# Patient Record
Sex: Female | Born: 1995 | Race: White | Hispanic: No | Marital: Single
Health system: Southern US, Community
[De-identification: ages and names within clinical notes are randomized; demographics above are authoritative.]

---

## 2016-03-29 ENCOUNTER — Encounter: Payer: Self-pay | Admitting: *Deleted

## 2016-03-29 ENCOUNTER — Ambulatory Visit (INDEPENDENT_AMBULATORY_CARE_PROVIDER_SITE_OTHER): Payer: Medicaid Other | Admitting: *Deleted

## 2016-03-29 ENCOUNTER — Encounter: Payer: Self-pay | Admitting: Family Medicine

## 2016-03-29 DIAGNOSIS — Z3201 Encounter for pregnancy test, result positive: Secondary | ICD-10-CM

## 2016-03-29 DIAGNOSIS — Z3687 Encounter for antenatal screening for uncertain dates: Secondary | ICD-10-CM

## 2016-03-29 LAB — POCT PREGNANCY, URINE: Preg Test, Ur: POSITIVE — AB

## 2016-03-29 NOTE — Progress Notes (Signed)
Here for pregnancy test which was positive. Would like to get prenatal care her. LMP 02/25/16 but states regular periods - last 7 days - this period was only 3 days. Will order [place] for dating and give letter- proof of pregnancy. Will call mid August for September appt.

## 2016-04-01 ENCOUNTER — Ambulatory Visit (HOSPITAL_COMMUNITY): Payer: Medicaid Other

## 2016-04-01 ENCOUNTER — Ambulatory Visit (HOSPITAL_COMMUNITY)
Admission: RE | Admit: 2016-04-01 | Discharge: 2016-04-01 | Disposition: A | Discharge status: Home / Self Care | Payer: Medicaid Other | Source: Ambulatory Visit | Attending: Obstetrics and Gynecology | Admitting: Obstetrics and Gynecology

## 2016-04-01 DIAGNOSIS — N8311 Corpus luteum cyst of right ovary: Secondary | ICD-10-CM | POA: Insufficient documentation

## 2016-04-01 DIAGNOSIS — Z36 Encounter for antenatal screening of mother: Secondary | ICD-10-CM | POA: Diagnosis not present

## 2016-04-01 DIAGNOSIS — Z3A01 Less than 8 weeks gestation of pregnancy: Secondary | ICD-10-CM | POA: Diagnosis not present

## 2016-04-01 DIAGNOSIS — O3481 Maternal care for other abnormalities of pelvic organs, first trimester: Secondary | ICD-10-CM | POA: Insufficient documentation

## 2016-04-01 DIAGNOSIS — Z3687 Encounter for antenatal screening for uncertain dates: Secondary | ICD-10-CM

## 2016-04-02 ENCOUNTER — Telehealth: Payer: Self-pay | Admitting: General Practice

## 2016-04-02 ENCOUNTER — Encounter: Payer: Self-pay | Admitting: General Practice

## 2016-04-02 DIAGNOSIS — Z3490 Encounter for supervision of normal pregnancy, unspecified, unspecified trimester: Secondary | ICD-10-CM

## 2016-04-02 NOTE — Telephone Encounter (Signed)
Per Dr Vergie Living, patient had ultrasound yesterday 7/31 but was too early to visualize. Patient needs f/u ultrasound in 2 weeks with appt after for results. Scheduled for 8/14 @ 10am. Called patient, no answer- unable to leave voicemail because it was full. Will send letter

## 2016-04-15 ENCOUNTER — Ambulatory Visit (INDEPENDENT_AMBULATORY_CARE_PROVIDER_SITE_OTHER): Payer: Medicaid Other | Admitting: Obstetrics and Gynecology

## 2016-04-15 ENCOUNTER — Ambulatory Visit (HOSPITAL_COMMUNITY)
Admission: RE | Admit: 2016-04-15 | Discharge: 2016-04-15 | Disposition: A | Discharge status: Home / Self Care | Payer: Medicaid Other | Source: Ambulatory Visit | Attending: Obstetrics and Gynecology | Admitting: Obstetrics and Gynecology

## 2016-04-15 DIAGNOSIS — Z3A01 Less than 8 weeks gestation of pregnancy: Secondary | ICD-10-CM | POA: Diagnosis not present

## 2016-04-15 DIAGNOSIS — Z3481 Encounter for supervision of other normal pregnancy, first trimester: Secondary | ICD-10-CM | POA: Diagnosis not present

## 2016-04-15 DIAGNOSIS — Z3490 Encounter for supervision of normal pregnancy, unspecified, unspecified trimester: Secondary | ICD-10-CM

## 2016-04-15 DIAGNOSIS — Z331 Pregnant state, incidental: Secondary | ICD-10-CM | POA: Insufficient documentation

## 2016-04-15 NOTE — Progress Notes (Signed)
Ultrasounds Results Note  SUBJECTIVE HPI:  Ms. Haley Mckenzie is a 20 y.o. G1P0 at Unknown by LMP who [facility] followup ultrasound results. The patient reports some vaginal spotting 2 days ago which has resolved.  Upon review of the patient's records, ultrasound showed IUGS without fetal pole on 7/31.  Repeat ultrasound was performed earlier today.   No past medical history on file. No past surgical history on file. Social History   Social History  . Marital status: Single    Spouse name: N/A  . Number of children: N/A  . Years of education: N/A   Occupational History  . Not on file.   Social History Main Topics  . Smoking status: Not on file  . Smokeless tobacco: Not on file  . Alcohol use Not on file  . Drug use: Unknown  . Sexual activity: Not on file   Other Topics Concern  . Not on file   Social History Narrative  . No narrative on file   No current outpatient prescriptions on file prior to visit.   No current facility-administered medications on file prior to visit.    Allergies not on file  I have reviewed patient's Past Medical Hx, Surgical Hx, Family Hx, Social Hx, medications and allergies.   Review of Systems Review of Systems  Constitutional: Negative for fever and chills.  Gastrointestinal: Negative for nausea, vomiting, abdominal pain, diarrhea and constipation.  Genitourinary: Negative for dysuria.  Musculoskeletal: Negative for back pain.  Neurological: Negative for dizziness and weakness.    Physical Exam  There were no vitals taken for this visit.  GENERAL: Well-developed, well-nourished female in no acute distress.  HEENT: Normocephalic, atraumatic.   LUNGS: Effort normal ABDOMEN: soft, non-tender HEART: Regular rate  SKIN: Warm, dry and without erythema PSYCH: Normal mood and affect NEURO: Alert and oriented x 4  LAB RESULTS No results found for this or any previous visit (from the past 24  hour(s)).  IMAGING [place] Ob Transvaginal  Result Date: 04/15/2016 CLINICAL DATA:  Evaluate fetal viability. EXAM: TRANSVAGINAL OB ULTRASOUND TECHNIQUE: Transvaginal ultrasound was performed for complete evaluation of the gestation as well as the maternal uterus, adnexal regions, and pelvic cul-de-sac. COMPARISON:  No prior. FINDINGS: Intrauterine gestational sac: Single Yolk sac:  Present Embryo:  Present Cardiac Activity: Present Heart Rate: 122 bpm CRL:   6.3  mm   6 w 3 d                  US EDC: 12/06/2016 Subchorionic hemorrhage:  None visualized. Maternal uterus/adnexae: 0.8 x 0.7 x 0.6 cm left adnexal simple cyst. Trace free pelvic fluid. IMPRESSION: Single viable intrauterine pregnancy at 6 weeks 3 days. Fetal heart rate 122 beats per minute. Trace free pelvic fluid. Electronically Signed   By: Maisie Fushomas  Register   On: 04/15/2016 10:09   [place] Ob Transvaginal  Result Date: 04/01/2016 CLINICAL DATA:  Uncertain LMP. LMP was06/25/2017. Gestational age by LMP is5 weeks 1 day. EDC by LMP is04/09/2016. EXAM: OBSTETRIC <14 WK ULTRASOUND TECHNIQUE: Transabdominal ultrasound was performed for evaluation of the gestation as well as the maternal uterus and adnexal regions. COMPARISON:  None. FINDINGS: Intrauterine gestational sac: Present Yolk sac:  Not seen Embryo:  Not seen Cardiac Activity: Not seen Heart Rate: Absent bpm MSD: 2.9  mm   5 w   0  d Subchorionic hemorrhage:  None visualized. Maternal uterus/adnexae: Right corpus luteum cyst is present. Trace free pelvic fluid noted. IMPRESSION:  1. Single intrauterine gestational sac which corresponds to estimated age of 5 weeks 0 days. 2. Yolk sac and embryo are not yet visible. 3. Recommend follow-up ultrasound in 14 days to establish dating. Electronically Signed   By: Norva PavlovElizabeth  Brown M.D.   On: 04/01/2016 12:27   ASSESSMENT 1. Pregnancy     PLAN Discharge home in stable condition Patient advised to start/continue taking prenatal vitamins Pregnancy  confirmation letter given [facility]  Catalina AntiguaPeggy Charlis Harner, MD  04/15/2016  10:44 AM

## 2018-05-31 IMAGING — US US OB TRANSVAGINAL
1 series · 15 of 27 positions shown · non-contrast
Comparison: None.

CLINICAL DATA: Uncertain LMP.

LMP was02/25/2016.
Gestational age by LMP is5 weeks 1 day.
EDC by LMP is12/01/2016.
EXAM:
OBSTETRIC <14 WK ULTRASOUND
TECHNIQUE: Transabdominal ultrasound was performed for evaluation of the
gestation as well as the maternal uterus and adnexal regions.

[Series 1: us ob transvaginal · 15 of 27 slices shown]
[im 1/27]
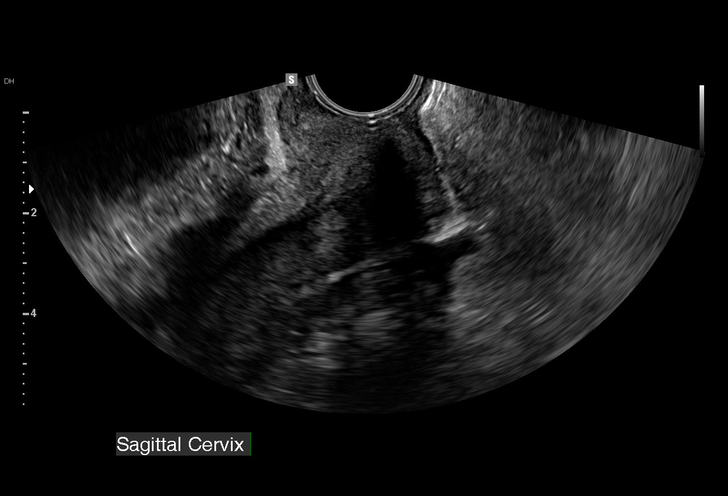
[im 3/27]
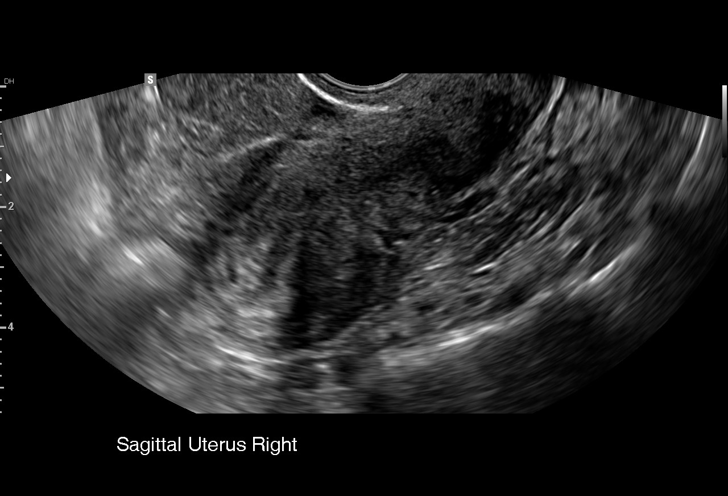
[im 5/27]
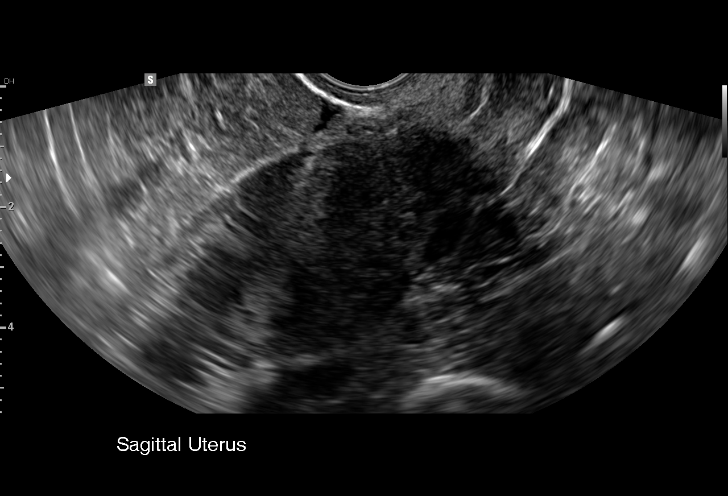
[im 7/27]
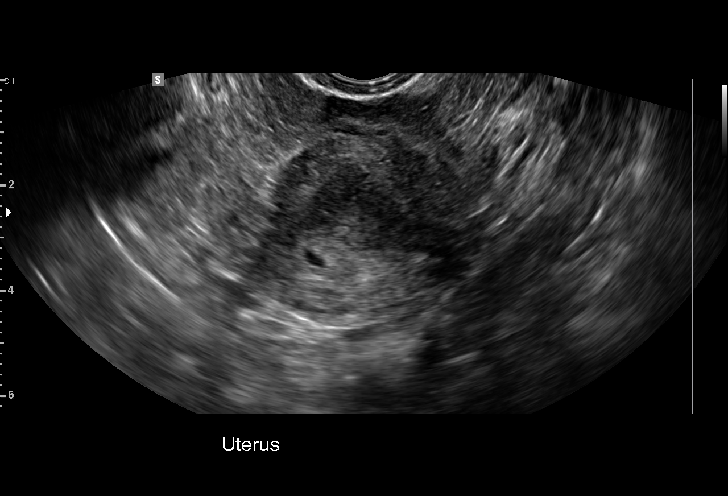
[im 9/27]
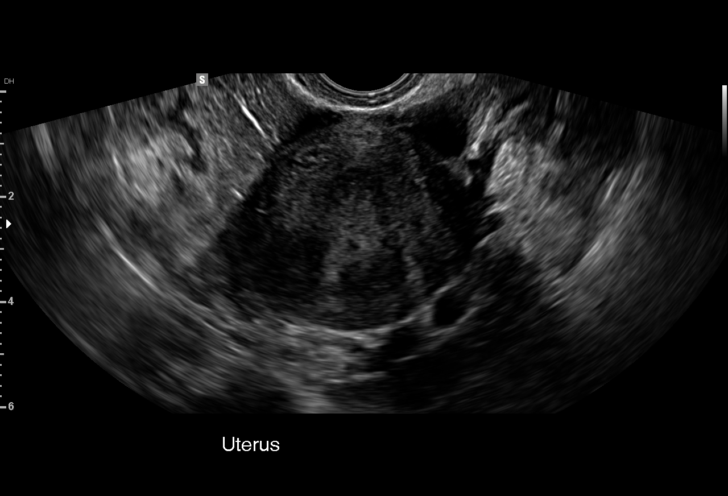
[im 10/27]
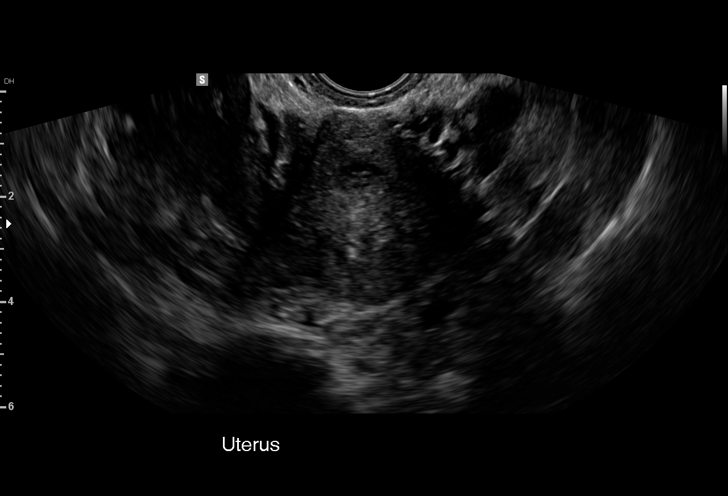
[im 12/27]
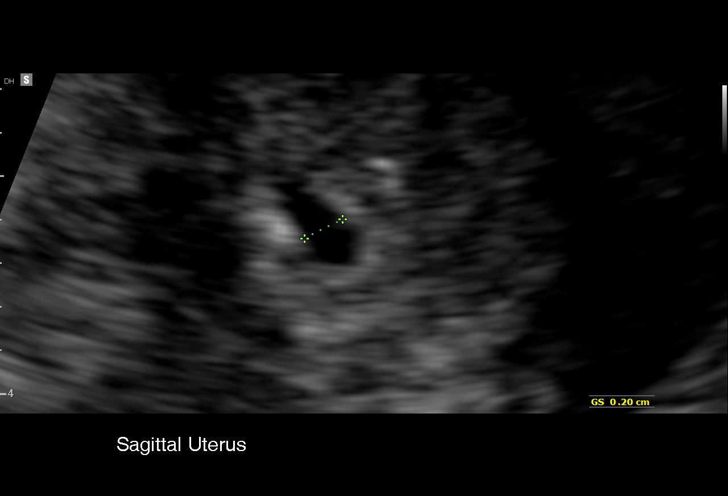
[im 14/27]
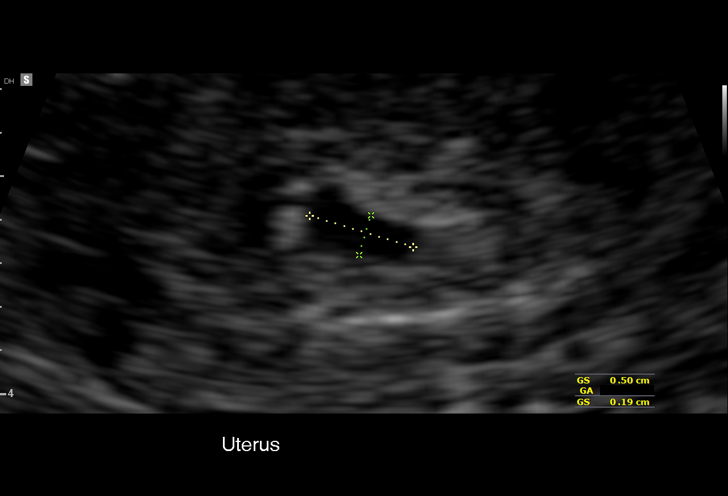
[im 16/27]
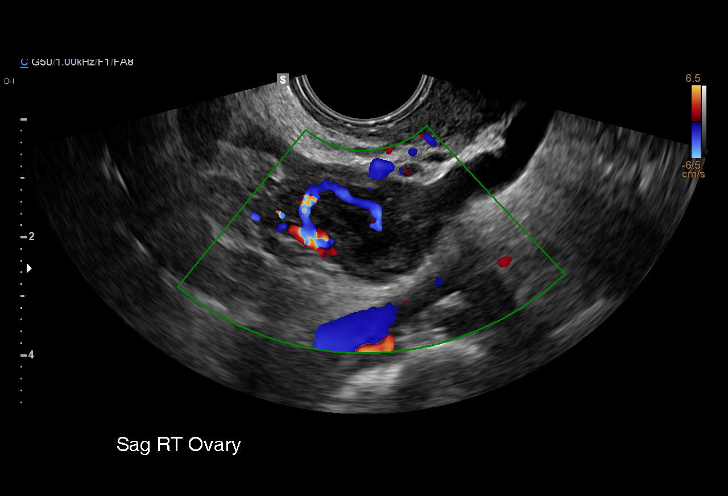
[im 18/27]
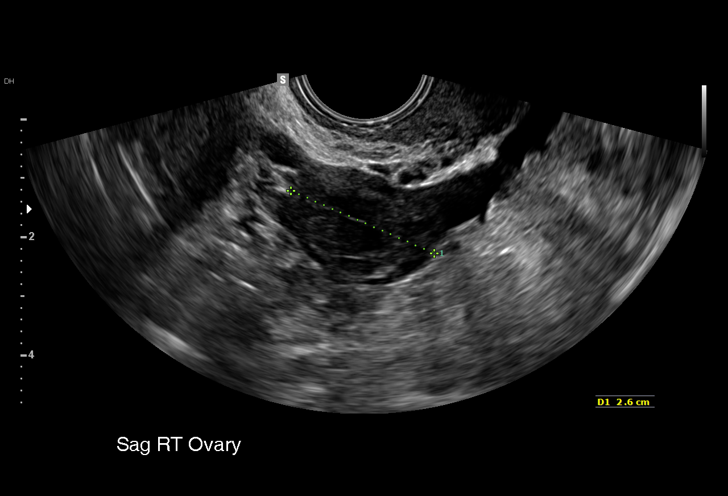
[im 19/27]
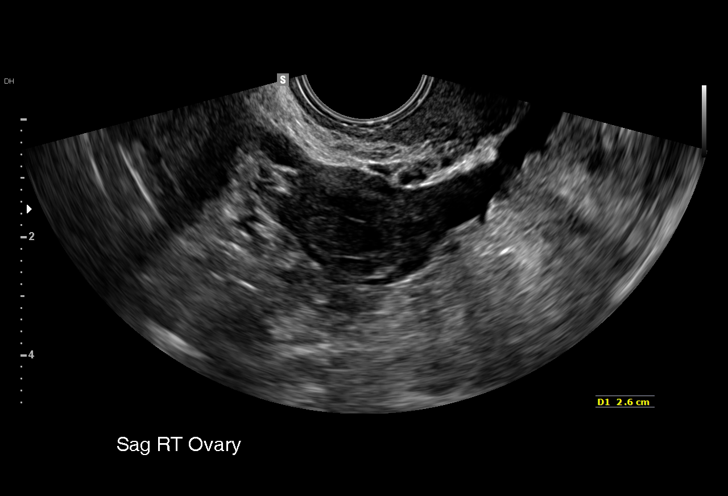
[im 21/27]
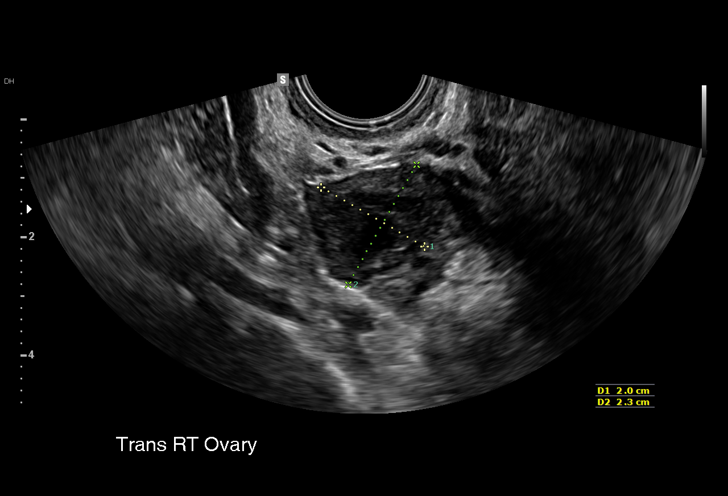
[im 23/27]
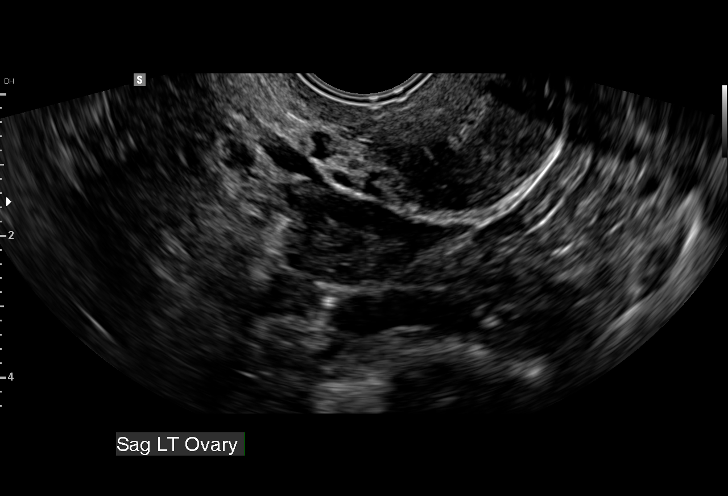
[im 25/27]
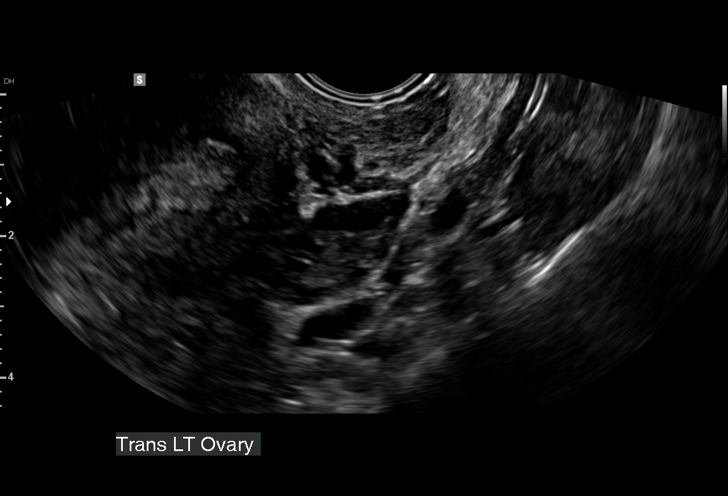
[im 27/27]
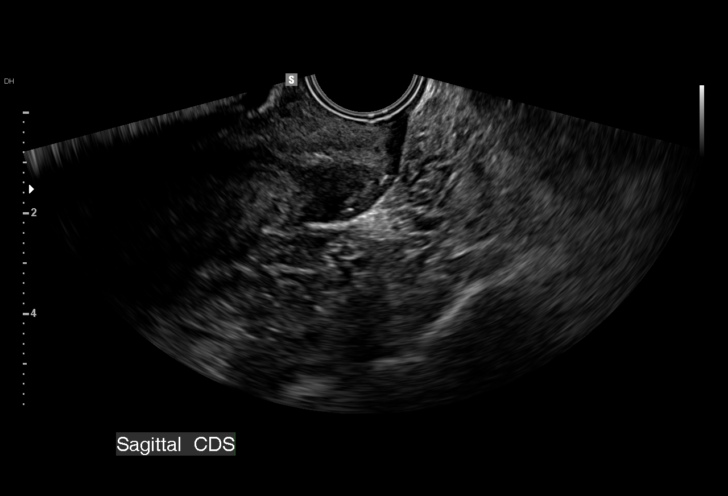

[15 of 27 positions shown; findings below may reference images not displayed]

FINDINGS: Intrauterine gestational sac: Present

Yolk sac:  Not seen

Embryo:  Not seen

Cardiac Activity: Not seen

Heart Rate: Absent bpm

MSD: 2.9  mm   5 w   0  d

Subchorionic hemorrhage:  None visualized.

Maternal uterus/adnexae: Right corpus luteum cyst is present. Trace
free pelvic fluid noted.
IMPRESSION: 1. Single intrauterine gestational sac which corresponds to
estimated age of 5 weeks 0 days.
2. Yolk sac and embryo are not yet visible.
3. Recommend follow-up ultrasound in 14 days to establish dating.

## 2018-06-14 IMAGING — US US OB TRANSVAGINAL
1 series · 15 of 28 positions shown · non-contrast
Comparison: No prior.

CLINICAL DATA: Evaluate fetal viability.

EXAM:
TRANSVAGINAL OB ULTRASOUND
TECHNIQUE: Transvaginal ultrasound was performed for complete evaluation of the
gestation as well as the maternal uterus, adnexal regions, and
pelvic cul-de-sac.

[Series 1: us ob transvaginal · 15 of 36 slices shown]
[im 1/36]
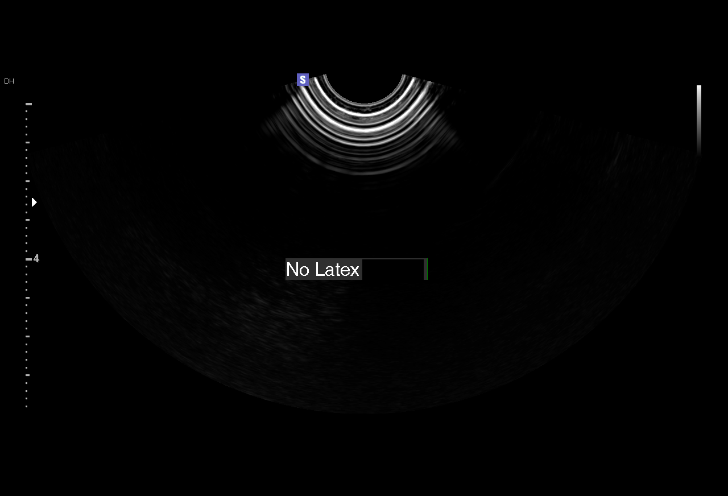
[im 3/36]
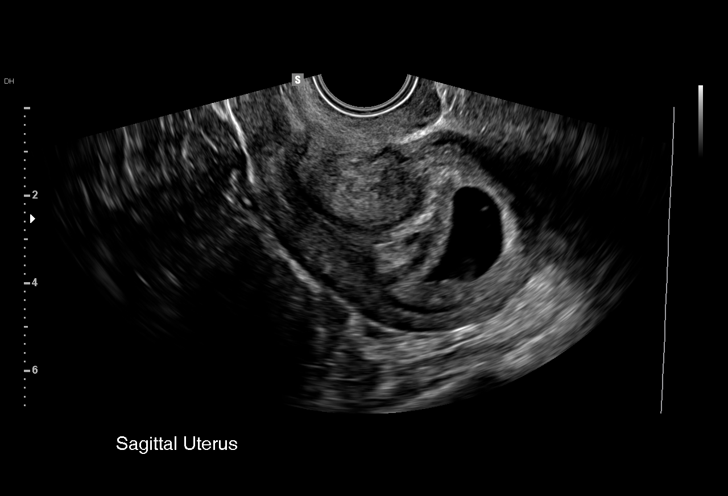
[im 6/36]
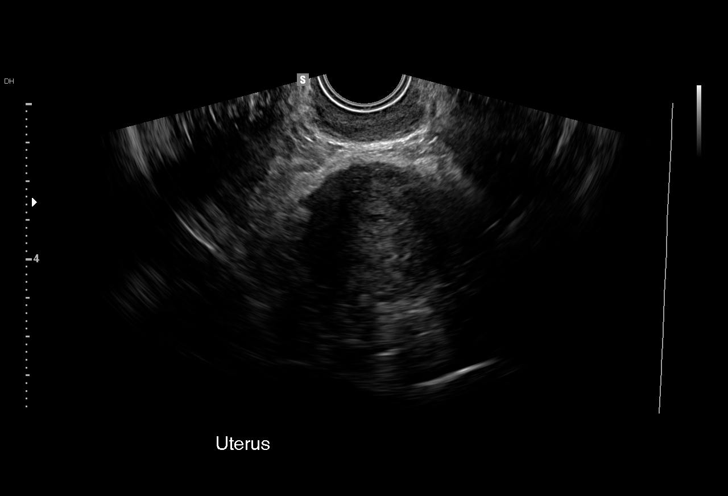
[im 8/36]
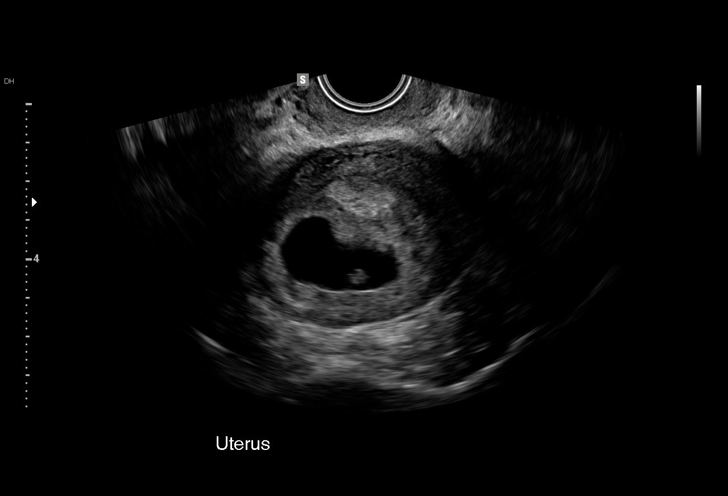
[im 11/36]
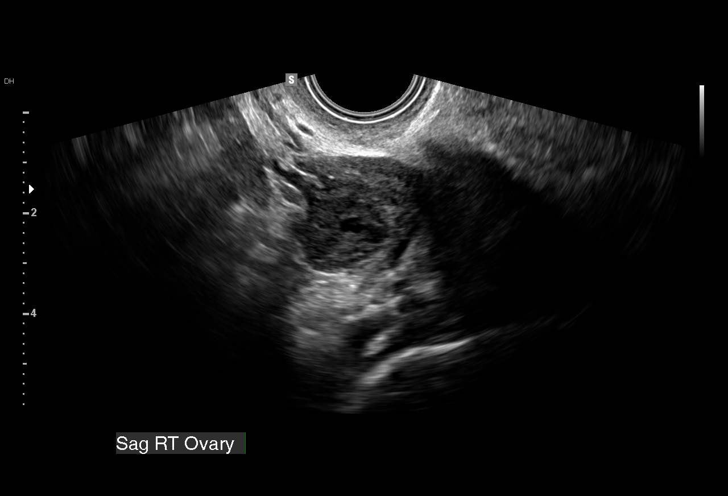
[im 13/36]
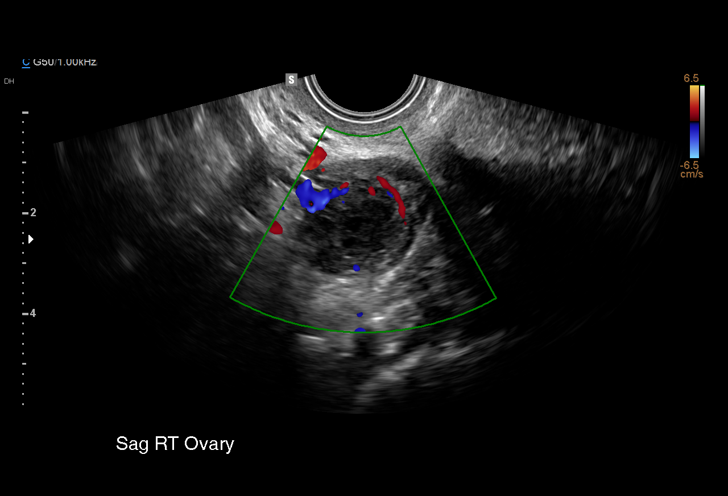
[im 16/36]
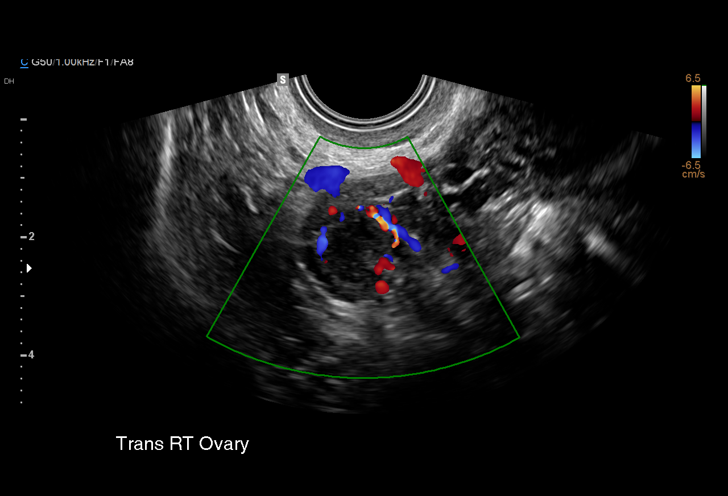
[im 19/36]
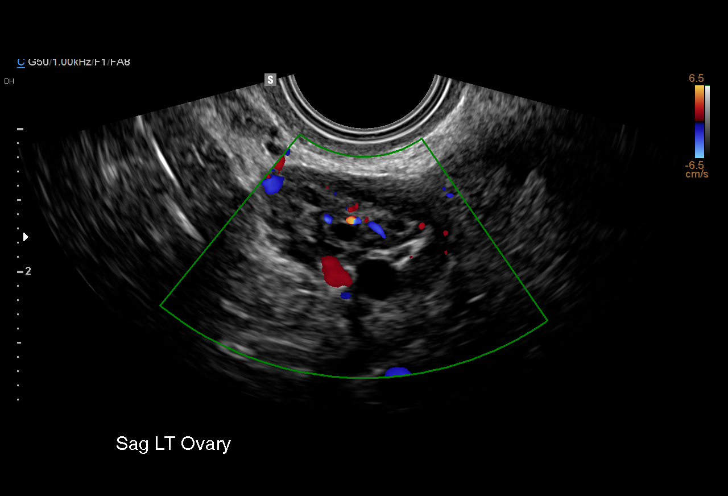
[im 20/36]
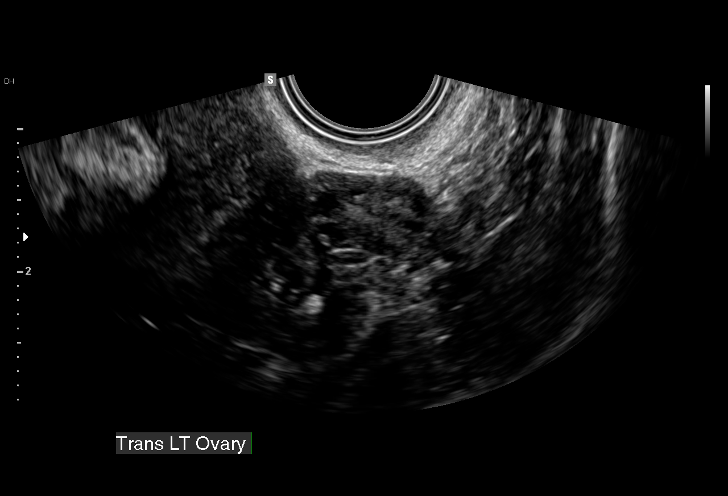
[im 23/36]
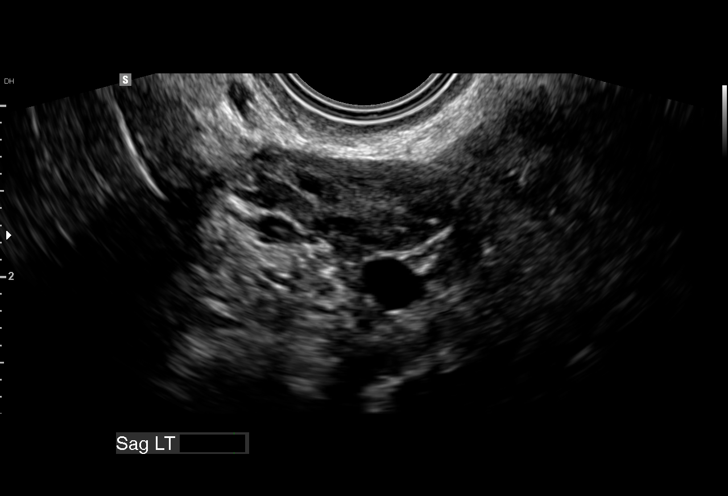
[im 25/36]
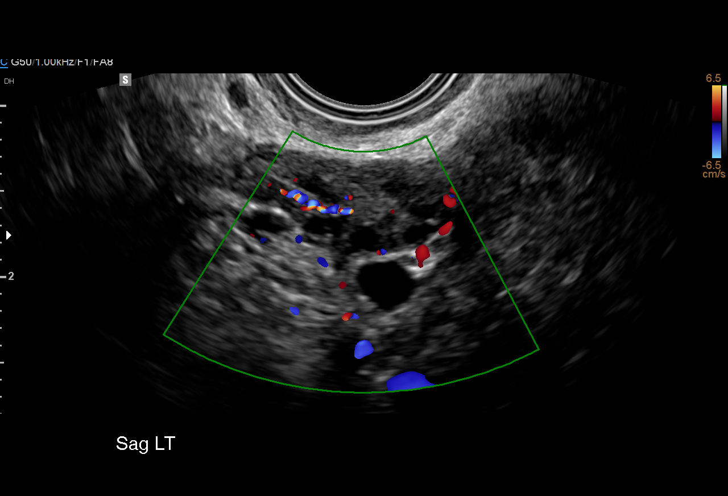
[im 28/36]
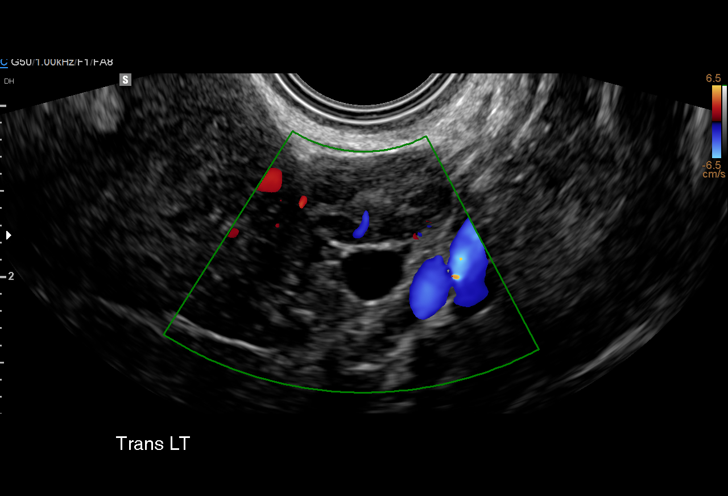
[im 30/36]
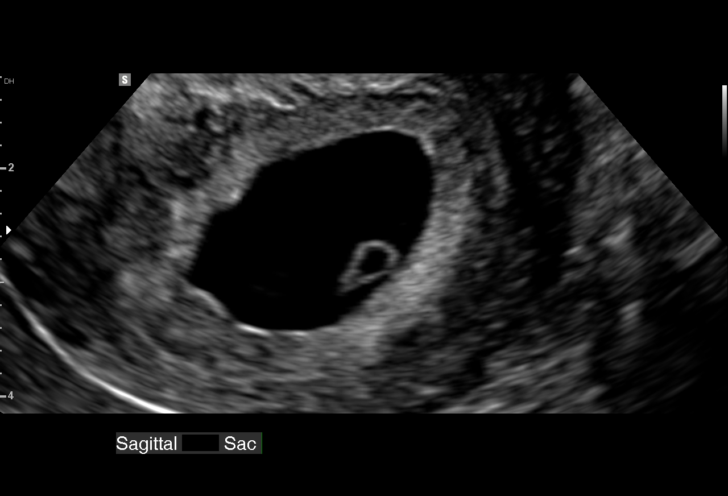
[im 33/36]
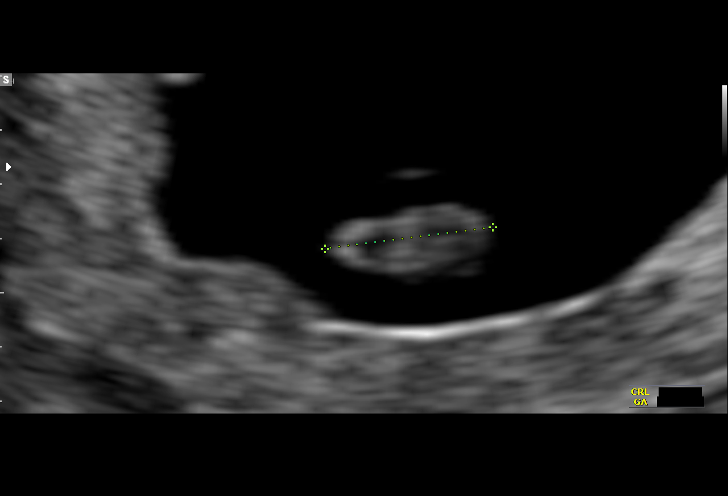
[im 36/36]
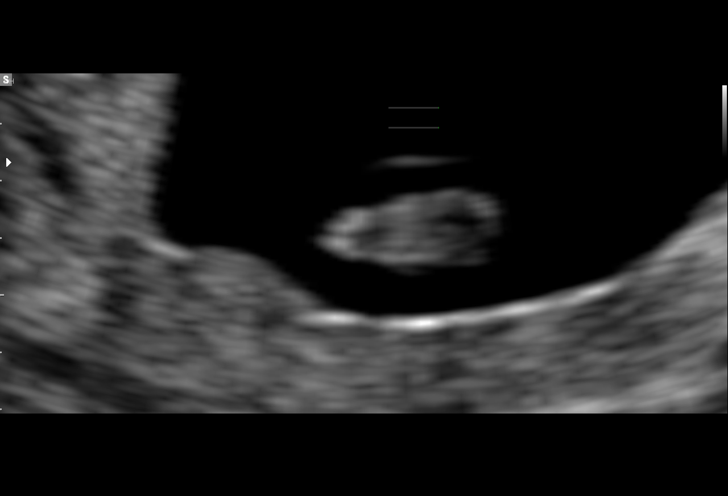

[15 of 28 positions shown; findings below may reference images not displayed]

FINDINGS: Intrauterine gestational sac: Single

Yolk sac:  Present

Embryo:  Present

Cardiac Activity: Present

Heart Rate: 122 bpm

CRL:   6.3  mm   6 w 3 d                  US EDC: 12/06/2016

Subchorionic hemorrhage:  None visualized.

Maternal uterus/adnexae: 0.8 x 0.7 x 0.6 cm left adnexal simple
cyst. Trace free pelvic fluid.
IMPRESSION: Single viable intrauterine pregnancy at 6 weeks 3 days. Fetal heart
rate 122 beats per minute. Trace free pelvic fluid.
